# Patient Record
Sex: Male | Born: 1945 | Race: White | Hispanic: No | Marital: Married
Health system: Midwestern US, Academic
[De-identification: ages and names within clinical notes are randomized; demographics above are authoritative.]

---

## 2017-03-04 ENCOUNTER — Encounter: Admit: 2017-03-04 | Discharge: 2017-03-04 | Payer: MEDICARE

## 2017-03-04 DIAGNOSIS — E119 Type 2 diabetes mellitus without complications: Principal | ICD-10-CM

## 2017-03-04 DIAGNOSIS — I10 Essential (primary) hypertension: ICD-10-CM

## 2017-03-04 NOTE — Progress Notes
Records Request    Medical records request for continuation of care:    Patient has appointment on 03/20/17   with  Dr. Danella MaiersSteven Owens* .    Please fax records to [facility]  [phone]    Request records:    Office Notes (Most recent)    EKG's           Any Cardiac Testing    Any cardiac-related records    Recent Labs          Thank you,      [facility]  [facility]  [address]  [place], [place] [id number]  Phone:  [phone]  Fax:  [phone]

## 2017-03-13 ENCOUNTER — Encounter: Admit: 2017-03-13 | Discharge: 2017-03-13 | Payer: MEDICARE

## 2017-03-18 ENCOUNTER — Encounter: Admit: 2017-03-18 | Discharge: 2017-03-18 | Payer: MEDICARE

## 2017-03-18 DIAGNOSIS — E785 Hyperlipidemia, unspecified: ICD-10-CM

## 2017-03-18 DIAGNOSIS — J449 Chronic obstructive pulmonary disease, unspecified: ICD-10-CM

## 2017-03-18 DIAGNOSIS — E119 Type 2 diabetes mellitus without complications: Principal | ICD-10-CM

## 2017-03-18 DIAGNOSIS — I10 Essential (primary) hypertension: ICD-10-CM

## 2017-03-20 ENCOUNTER — Encounter: Admit: 2017-03-20 | Discharge: 2017-03-20 | Payer: MEDICARE

## 2017-03-20 ENCOUNTER — Ambulatory Visit: Admit: 2017-03-20 | Discharge: 2017-03-21 | Payer: MEDICARE

## 2017-03-20 DIAGNOSIS — E119 Type 2 diabetes mellitus without complications: Principal | ICD-10-CM

## 2017-03-20 DIAGNOSIS — R600 Localized edema: ICD-10-CM

## 2017-03-20 DIAGNOSIS — E785 Hyperlipidemia, unspecified: ICD-10-CM

## 2017-03-20 DIAGNOSIS — I10 Essential (primary) hypertension: ICD-10-CM

## 2017-03-20 DIAGNOSIS — J449 Chronic obstructive pulmonary disease, unspecified: ICD-10-CM

## 2017-03-20 DIAGNOSIS — R609 Edema, unspecified: Principal | ICD-10-CM

## 2017-03-20 NOTE — Assessment & Plan Note
Blood pressure seems reasonably well controlled on current medical program.

## 2017-03-20 NOTE — Assessment & Plan Note
Unfortunately he his smoking about a third pack per day.

## 2017-03-20 NOTE — Progress Notes
Date of Service: 03/20/2017    Austin Barrett is a 71 y.o. male.       HPI     Austin Barrett was in the [place] office today with his wife.  I had seen him about 4 years ago for some complaints of chest discomfort and his workup at that time was fairly unremarkable.    About 2 years ago he had back surgery and says that he has had a lot of trouble with gait instability and peripheral edema since then.    I do not think he is having angina symptoms nor is he having palpitations, syncope, or near syncope.  Most of his complaint has to do with persistent lower extremity edema.    He is not very active in terms of physical exercise.  He does help a lot caring for his wife who is chronically ill.         Vitals:    03/20/17 1509 03/20/17 1532   BP: (!) 172/100 (!) 170/108   Pulse: 64    Weight: 97.6 kg (215 lb 3.2 oz)    Height: 1.778 m (5' 10)      Body mass index is 30.88 kg/m???.     Past Medical History  Patient Active Problem List    Diagnosis Date Noted   ??? Bilateral leg edema 03/20/2017   ??? Hyperlipemia 03/18/2017   ??? COPD (chronic obstructive pulmonary disease) (HCC) 03/18/2017   ??? Screening for cardiovascular condition 05/02/2013     03/29/2013 - Carotid Duplex @ [facility]--no significant obstructive disease    03/2013 - Holter ([facility]) showed some episodes of ventricular quadrigeminy (asymptomatic)    2014 Echo demonstrated increased septal notch thickening without evidence of increased VOT gradient EF 75% with hyperdynamic LV, mild MR, and heart rat 100-110 possible a-fib    2014  Nuc for syncope palp, low risk no ischemia  .  12/13/2016 24hr Holter:  5hrs base line SR Min HR 62 Max 118 Few PVC seen.  No significant atrial or ventricular dysrhythmias noted.    12/04/2016 Echo  LV size and global wall motion normal.  Doppler evidence grade 1(impaired) diastolic dysfunction. No valvular pathology.     ??? History of tobacco abuse 05/02/2013     03/2013 - Quit smoking history of 42 years smoking     ??? Chest pain 05/02/2013 04/28/13:  MPI (done in [facility]):  Non-ischemic.  Normal EF.     ??? Hypertension 04/20/2013     02/2013 - [facility] hospitalization for hypertensive emergency (258/108) with syncope.     ??? Diabetes mellitus (HCC) 04/20/2013     2005 - Diagnosis established after presenting with light headedness and blurred vision     ??? History of colon cancer 04/20/2013     2000     ??? BPH (benign prostatic hyperplasia) 04/20/2013   ??? GERD (gastroesophageal reflux disease) 04/20/2013         Review of Systems   Constitution: Positive for chills, decreased appetite, diaphoresis, fever, weakness, malaise/fatigue, night sweats and weight gain.   HENT: Positive for hearing loss.    Eyes: Positive for blurred vision, discharge, double vision, pain, photophobia, redness, vision loss in left eye, vision loss in right eye, visual disturbance and visual halos.   Cardiovascular: Positive for claudication and leg swelling.   Respiratory: Positive for cough and snoring.    Endocrine: Positive for polyuria.   Hematologic/Lymphatic: Bruises/bleeds easily.   Skin: Positive for dry skin and nail changes.   Musculoskeletal:  Positive for arthritis, back pain, falls, joint pain, joint swelling, muscle weakness and myalgias.   Gastrointestinal: Negative.    Genitourinary: Negative.    Psychiatric/Behavioral: Negative.    Allergic/Immunologic: Negative.        Physical Exam    Physical Exam   General Appearance: no distress   Skin: warm, no ulcers or xanthomas   Digits and Nails: no cyanosis or clubbing   Eyes: conjunctivae and lids normal, pupils are equal and round   Teeth/Gums/Palate: dentition unremarkable, no lesions   Lips & Oral Mucosa: no pallor or cyanosis   Neck Veins: normal JVP , neck veins are not distended   Thyroid: no nodules, masses, tenderness or enlargement   Chest Inspection: chest is normal in appearance   Respiratory Effort: breathing comfortably, no respiratory distress Auscultation/Percussion: lungs clear to auscultation, no rales or rhonchi, no wheezing   PMI: PMI not enlarged or displaced   Cardiac Rhythm: regular rhythm and normal rate   Cardiac Auscultation: S1, S2 normal, no rub, no gallop   Murmurs: no murmur   Peripheral Circulation: normal peripheral circulation   Carotid Arteries: normal carotid upstroke bilaterally, no bruits   Radial Arteries: normal symmetric radial pulses   Abdominal Aorta: no abdominal aortic bruit   Pedal Pulses: normal symmetric pedal pulses   Lower Extremity Edema: no lower extremity edema   Abdominal Exam: soft, non-tender, no masses, bowel sounds normal   Liver & Spleen: no organomegaly   Gait & Station: walks with a walker  Muscle Strength: normal muscle tone   Orientation: oriented to time, place and person   Affect & Mood: appropriate and sustained affect   Language and Memory: patient responsive and seems to comprehend information   Neurologic Exam: neurological assessment grossly intact   Other: moves all extremities      Cardiovascular Studies    EKG:  Sinus rhythm, rate 64.  First degree AV block.    Problems Addressed Today  Encounter Diagnoses   Name Primary?   ??? Edema, unspecified type Yes   ??? Hypertension, unspecified type    ??? Bilateral leg edema        Assessment and Plan       Hypertension  Blood pressure seems reasonably well controlled on current medical program.    Hyperlipemia  Lab Results   Component Value Date    CHOL 131 11/12/2016    TRIG 268 (H) 11/12/2016    HDL 42 11/12/2016    LDL 56.3 11/12/2016      LDL is treated to goal.    History of tobacco abuse  Unfortunately he his smoking about a third pack per day.    Bilateral leg edema  I have ordered an echocardiogram to assess for any cardiac structural abnormalities that might predispose him to peripheral edema.      Current Medications (including today's revisions)  ??? calcium carbonate/vitamin D3 (CALCIUM + D PO) Take 1 tablet by mouth twice daily. ??? cholecalciferol(+) (VITAMIN D-3) 2,000 unit tablet Take 2,000 Units by mouth daily.   ??? esomeprazole DR(+) (NEXIUM) 40 mg capsule Take 40 mg by mouth every morning. Take on an empty stomach at least 1 hour before or 2 hours after food.   ??? furosemide (LASIX) 40 mg tablet Take 40 mg by mouth every morning.   ??? gabapentin (NEURONTIN) 300 mg capsule Take 300 mg by mouth three times daily.   ??? glipiZIDE (GLUCOTROL) 10 mg tablet Take 10 mg by mouth twice daily.   ???  losartan(+) (COZAAR) 100 mg tablet Take 100 mg by mouth daily.   ??? metFORMIN (GLUCOPHAGE) 1,000 mg tablet Take 1,000 mg by mouth twice daily with meals.   ??? pantoprazole DR (PROTONIX) 20 mg tablet Take 20 mg by mouth daily.   ??? pravastatin (PRAVACHOL) 40 mg tablet Take 40 mg by mouth at bedtime daily.   ??? tamsulosin (FLOMAX) 0.4 mg capsule Take 0.4 mg by mouth daily. Do not crush, chew or open capsules. Take 30 minutes following the same meal each day.   ??? terazosin (HYTRIN) 1 mg capsule Take 1 mg by mouth at bedtime daily.   ??? traZODone (DESYREL) 100 mg tablet Take 100 mg by mouth at bedtime daily.

## 2017-03-20 NOTE — Assessment & Plan Note
Lab Results   Component Value Date    CHOL 131 11/12/2016    TRIG 268 (H) 11/12/2016    HDL 42 11/12/2016    LDL 56.3 11/12/2016      LDL is treated to goal.

## 2017-03-20 NOTE — Assessment & Plan Note
I have ordered an echocardiogram to assess for any cardiac structural abnormalities that might predispose him to peripheral edema.

## 2017-04-11 ENCOUNTER — Ambulatory Visit: Admit: 2017-04-11 | Discharge: 2017-04-12 | Payer: MEDICARE

## 2017-04-11 DIAGNOSIS — I10 Essential (primary) hypertension: ICD-10-CM

## 2017-04-11 DIAGNOSIS — R609 Edema, unspecified: Principal | ICD-10-CM

## 2017-04-11 MED ORDER — PERFLUTREN LIPID MICROSPHERES 1.1 MG/ML IV SUSP
1-20 mL | Freq: Once | INTRAVENOUS | 0 refills | Status: CP
Start: 2017-04-11 — End: ?

## 2017-04-11 NOTE — Progress Notes
Peripheral IV Insertion Note:  Patient Side: left  Line Orientation:Other (Comment)upper arm  IV Catheter Size: 22G  Number of Attempts:1.  IV capped and flushed with Normal Saline.  IV site without redness, swelling, or pain.  New dressing placed.    After procedure IV cannula removed intact and hemostasis achieved.    Procedure explained, questions answered and Definity administered per standard without complications.   Total of _2__ ml of Definity/NS given slow IVP per sonographer direction.    Pt's BP was 199/103.  Pt denied any symptoms.  Discussed with patient.  He states that he never takes anything for BP and has discontinued the losartan on his own.  He states that he saw his PCP last week and his BP was 207/123.  He states, "I've had a good life, I'm not taking anything for it, if I die, I die."  Discussed with patient the increased risk of stroke with elevated BP.  He verbalized understanding, but would not like me to discuss with provider.

## 2017-04-14 ENCOUNTER — Encounter: Admit: 2017-04-14 | Discharge: 2017-04-14 | Payer: MEDICARE

## 2017-04-14 NOTE — Telephone Encounter
Left message with results and Dr. Barry Dieneswens' recommendations.  Left callback number for any questions or concerns.

## 2017-04-14 NOTE — Telephone Encounter
-----   Message from Vanice SarahSteven D Owens, MD sent at 04/14/2017  3:00 PM CDT -----  [facility], can you please let Greggory StallionGeorge know that his echocardiogram looks fine?  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    Cc:  Dr. Angelito HughKoch-Shamburg

## 2018-01-01 ENCOUNTER — Encounter: Admit: 2018-01-01 | Discharge: 2018-01-01 | Payer: MEDICARE

## 2018-01-15 ENCOUNTER — Ambulatory Visit: Admit: 2018-01-15 | Discharge: 2018-01-16 | Payer: MEDICARE

## 2018-01-15 ENCOUNTER — Encounter: Admit: 2018-01-15 | Discharge: 2018-01-15 | Payer: MEDICARE

## 2018-01-15 DIAGNOSIS — I10 Essential (primary) hypertension: ICD-10-CM

## 2018-01-15 DIAGNOSIS — J449 Chronic obstructive pulmonary disease, unspecified: ICD-10-CM

## 2018-01-15 DIAGNOSIS — E118 Type 2 diabetes mellitus with unspecified complications: ICD-10-CM

## 2018-01-15 DIAGNOSIS — E785 Hyperlipidemia, unspecified: ICD-10-CM

## 2018-01-15 DIAGNOSIS — K089 Disorder of teeth and supporting structures, unspecified: ICD-10-CM

## 2018-01-15 DIAGNOSIS — Z85038 Personal history of other malignant neoplasm of large intestine: ICD-10-CM

## 2018-01-15 DIAGNOSIS — Z87891 Personal history of nicotine dependence: ICD-10-CM

## 2018-01-15 DIAGNOSIS — E7800 Pure hypercholesterolemia, unspecified: Principal | ICD-10-CM

## 2018-01-15 DIAGNOSIS — Z01810 Encounter for preprocedural cardiovascular examination: ICD-10-CM

## 2018-01-15 DIAGNOSIS — E119 Type 2 diabetes mellitus without complications: Principal | ICD-10-CM

## 2018-01-20 ENCOUNTER — Encounter: Admit: 2018-01-20 | Discharge: 2018-01-20 | Payer: MEDICARE

## 2018-01-21 ENCOUNTER — Encounter: Admit: 2018-01-21 | Discharge: 2018-01-21 | Payer: MEDICARE

## 2019-01-28 ENCOUNTER — Encounter: Admit: 2019-01-28 | Discharge: 2019-01-28

## 2019-01-28 NOTE — Progress Notes
Per Lenna Sciara F- Pt overdue for OV w/ [facility]. attempted to reach pt to schedule follow up; no answer. LMOM w/ direct number and scheduling number to return call

## 2019-03-30 ENCOUNTER — Encounter: Admit: 2019-03-30 | Discharge: 2019-03-30

## 2021-09-05 ENCOUNTER — Encounter: Admit: 2021-09-05 | Discharge: 2021-09-05 | Payer: MEDICARE

## 2021-09-05 NOTE — Progress Notes
76 yo male pmhx of DM, CHF, COPD, HTN, HLD, GERD, BPH  To ER today with left sided chest pain non radiating, reproducible with palpation - no relief with Tylenol  CXR - nothing acute  EKG, sinus atrial rhythm, No ST changes  Troponin WNL  0.015  130/67  50  16  94% on RA  97.2  BNP  7070  K 5.5    BUN 48  Cr 4.6    remaining labs WNL    Pt has followed with cardiology in the past.  Sending states they are unable to admit Pt there due to level of care needed

## 2022-05-01 ENCOUNTER — Encounter: Admit: 2022-05-01 | Discharge: 2022-05-01 | Payer: MEDICARE

## 2023-01-29 IMAGING — CT BRAIN WO(Adult)
3 of 4 series · 14 of 47 positions shown, 16 images · non-contrast
Comparison: none

[Series 4: brain cor 5.00 hr40 s3 · coronal · 0.33mm/px · 3 of 42 slices shown]
[im 14/42  brain]
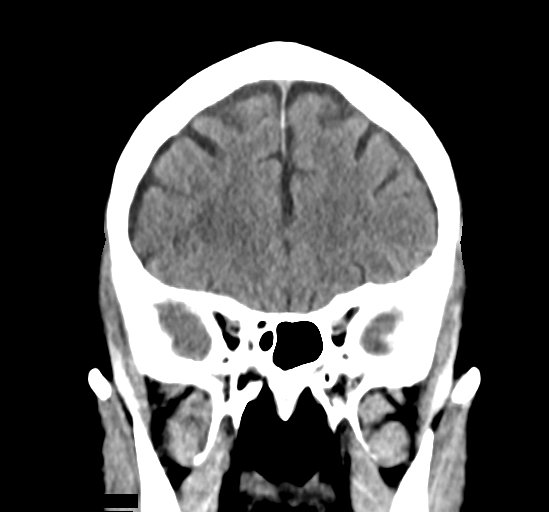
[im 19/42  brain]
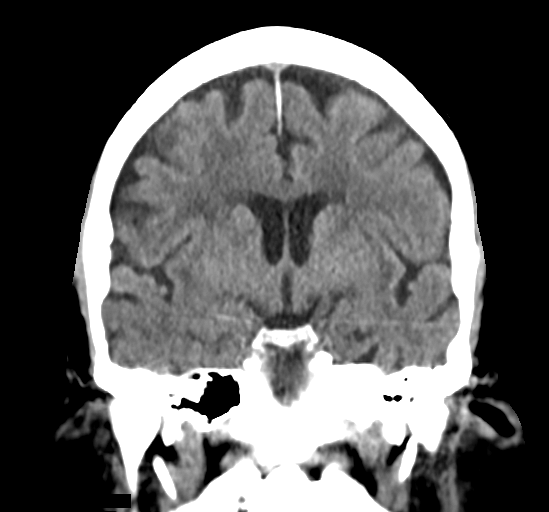
[im 23/42  brain]
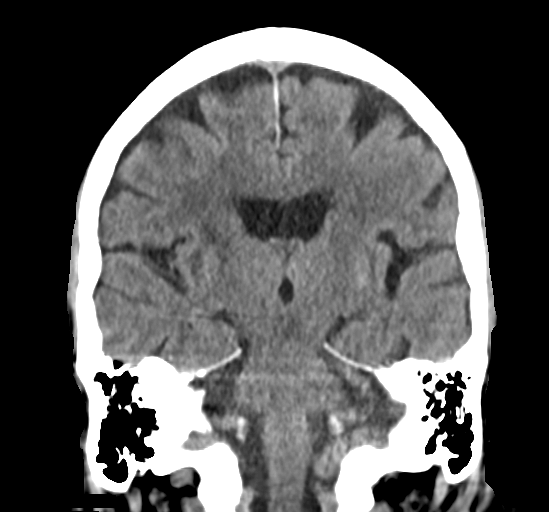

[Series 6: brain sag 5.00 hr40 s3 · sagittal · 0.33mm/px · 3 of 36 slices shown]
[im 12/36  brain]
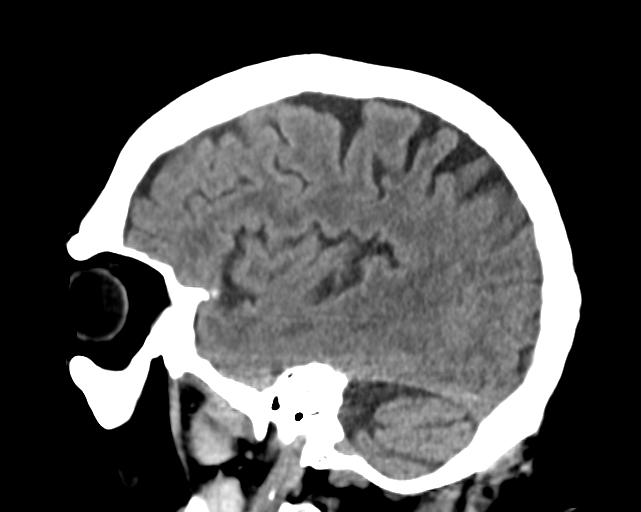
[im 18/36  brain]
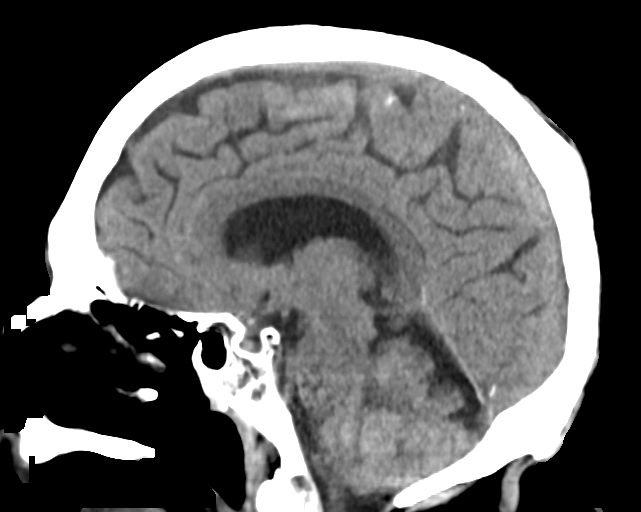
[im 24/36  brain]
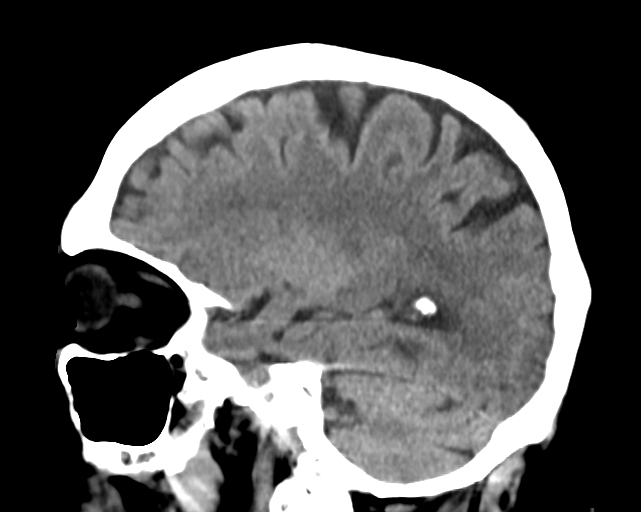

[Series 8: brain ax 2.00 hr60 s3 · axial · 0.36mm/px · z∈[-509,-375]mm · 8 of 85 slices shown, 10 images]
[im 9/85  brain]
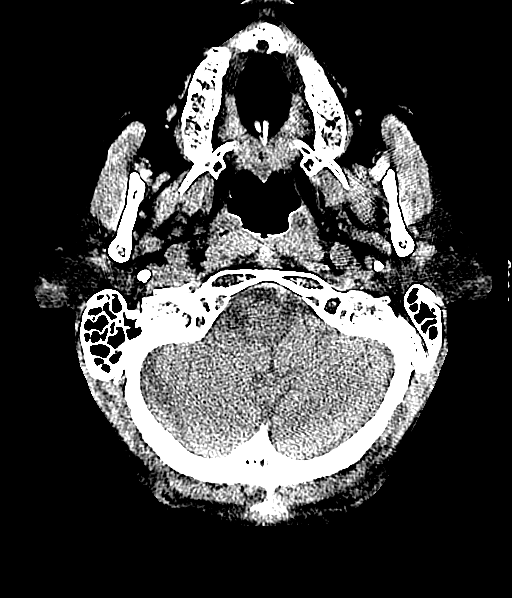
[im 9/85  bone]
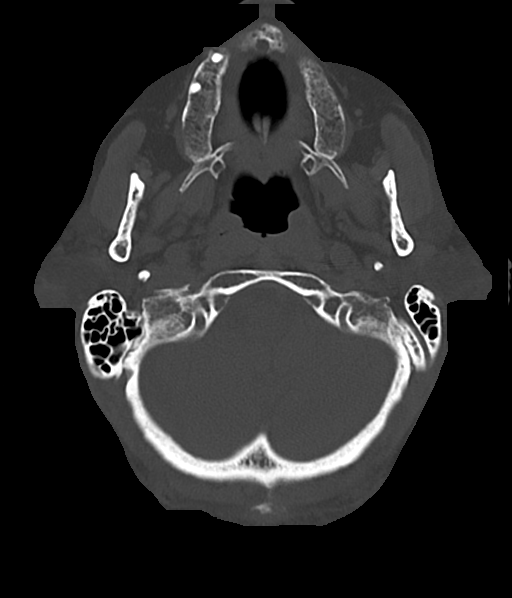
[im 17/85  brain]
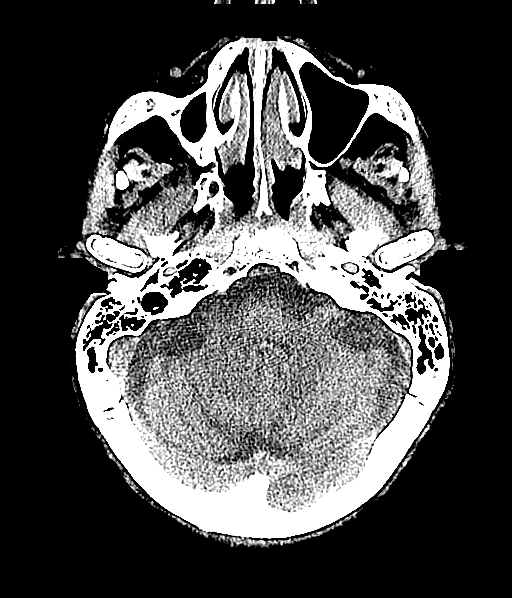
[im 26/85  brain]
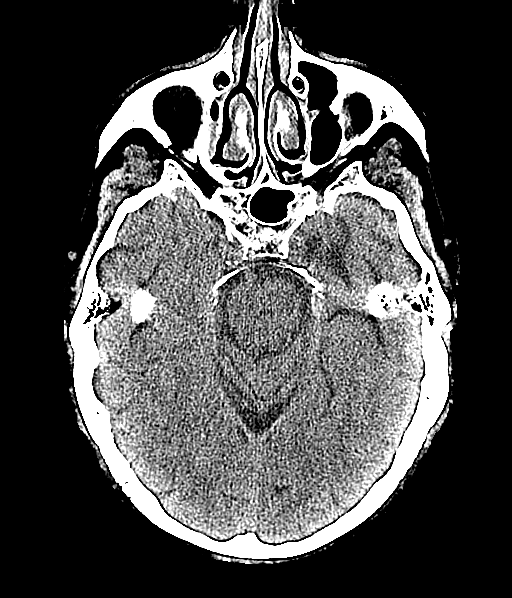
[im 38/85  brain]
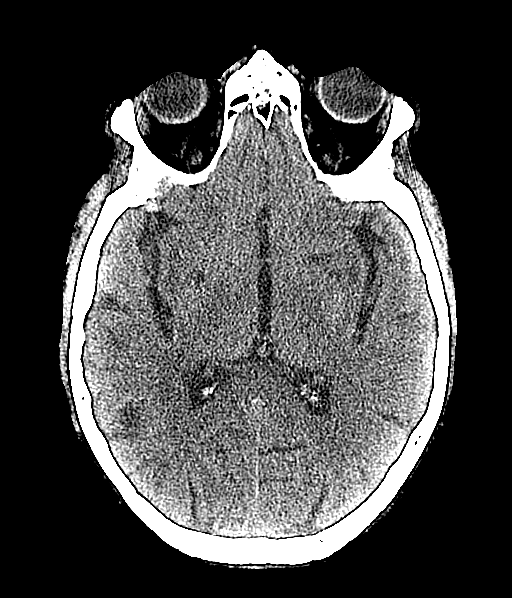
[im 47/85  brain]
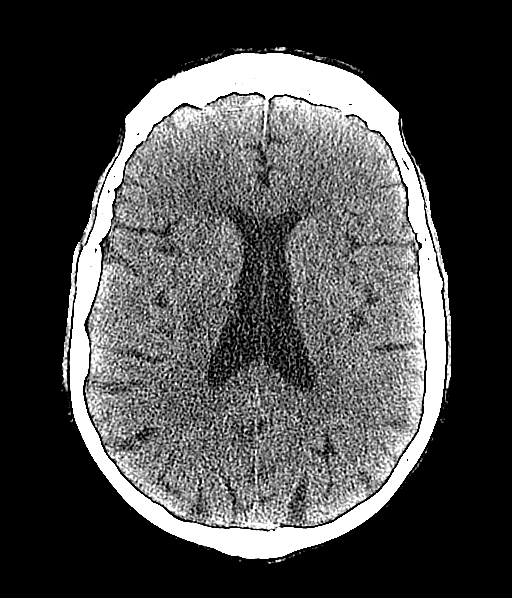
[im 47/85  bone]
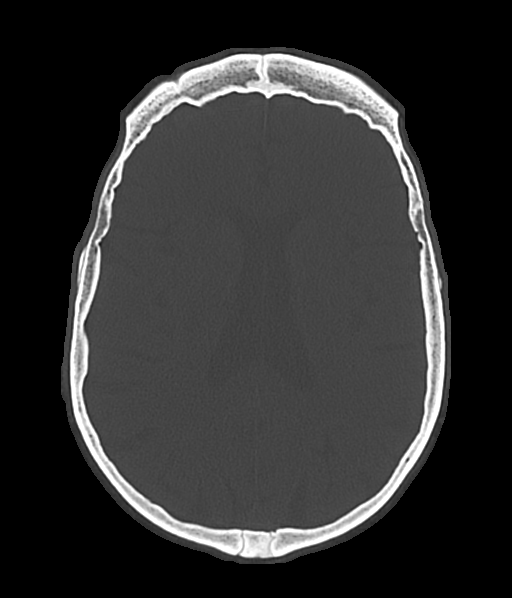
[im 59/85  brain]
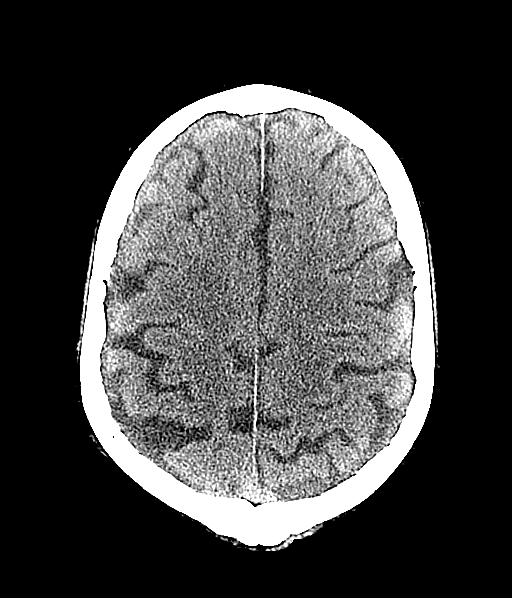
[im 68/85  brain]
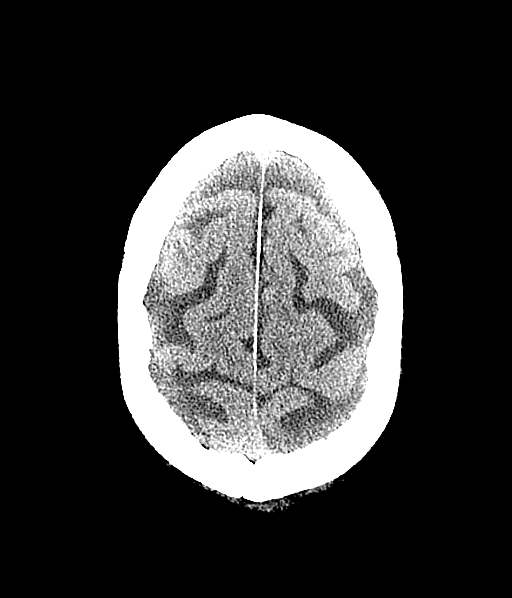
[im 76/85  brain]
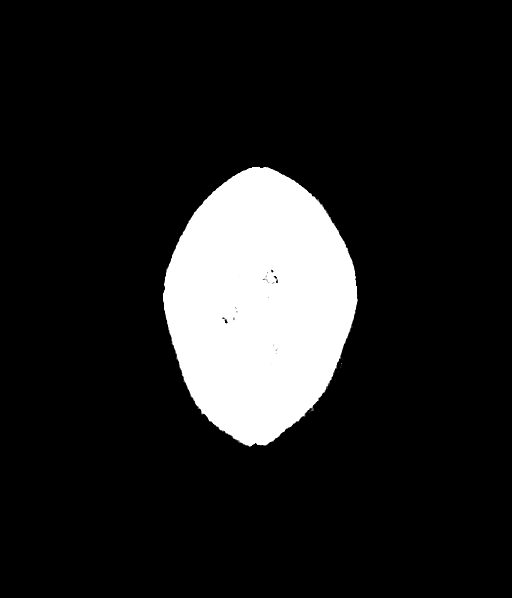

[14 of 47 positions shown; findings below may reference images not displayed]

DIAGNOSTIC STUDIES

EXAM

CT HEAD WITHOUT CONTRAST

INDICATION

headache, altered mental status
HEAD PAIN, PASSING OUT, ELEVATED BP, NO TRAUMA TO HEAD. CT/NM 4/0

TECHNIQUE

Helically acquired CT imaging of the head was obtained and subsequently reconstructed in the axial,
coronal, and sagittal planes.

No IV contrast was utilized for this study.

All CT scans at this facility use dose modulation, iterative reconstruction, and/or weight based
dosing when appropriate to reduce radiation dose to as low as reasonably achievable.

Number of previous computed tomography exams in the last 12 months is 0.

Number of previous nuclear medicine myocardial perfusion studies in the last 12 months is 0.

COMPARISONS

CT head, February 03, 2018.

FINDINGS

BRAIN: Mild cerebral and cerebellar volume loss with chronic microangiopathic ischemic changes of
white matter. There is no evidence of acute infarction, and gray-white differentiation is maintained
throughout. There is no intracranial hemorrhage.  There is no evidence of mass effect.

VENTRICLES AND CISTERNS: There is no midline shift. The ventricles are symmetric and normally
configured. There is no indication of hydrocephalus. The basal cisterns are patent.

VASCULATURE: The cavernous carotids and vertebral vessels are unremarkable on this noncontrast
study.

EXTRA-AXIAL: Extra-axial spaces are normal.

ORBITS: Prior bilateral lens surgery. Remaining orbital contents are normal.

PARANASAL SINUSES AND MASTOIDS: Clear.

BONES: There is no acute calvarial or skull base fracture. Imaged portions of the facial bones are
normal. No suspicious osteolytic or osteolytic blastic lesion.

IMPRESSION

1. No definitive acute intracranial abnormalities. If there is continued or progressed clinical
concern, additional assessment by MRI of the brain could be considered.
2. Remaining chronic/nonemergent findings as detailed above.

Tech Notes:

HEAD PAIN, PASSING OUT, ELEVATED BP, NO TRAUMA TO HEAD. CT/NM 4/0
